# Patient Record
Sex: Male | Born: 2000 | Race: Black or African American | Hispanic: No | Marital: Single | Smoking: Never smoker
Health system: Southern US, Community
[De-identification: ages and names within clinical notes are randomized; demographics above are authoritative.]

## PROBLEM LIST (undated history)

## (undated) DIAGNOSIS — R040 Epistaxis: Secondary | ICD-10-CM

## (undated) DIAGNOSIS — J45909 Unspecified asthma, uncomplicated: Secondary | ICD-10-CM

## (undated) HISTORY — PX: HERNIA REPAIR: SHX51

---
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
## 2020-01-28 ENCOUNTER — Ambulatory Visit: Payer: Self-pay

## 2021-02-16 ENCOUNTER — Other Ambulatory Visit: Payer: Self-pay

## 2021-02-16 ENCOUNTER — Ambulatory Visit: Payer: Self-pay | Admitting: Physician Assistant

## 2021-02-16 DIAGNOSIS — Z202 Contact with and (suspected) exposure to infections with a predominantly sexual mode of transmission: Secondary | ICD-10-CM

## 2021-02-16 DIAGNOSIS — Z113 Encounter for screening for infections with a predominantly sexual mode of transmission: Secondary | ICD-10-CM

## 2021-02-16 MED ORDER — DOXYCYCLINE HYCLATE 100 MG PO TABS: 100.0000 mg | ORAL_TABLET | Freq: Two times a day (BID) | ORAL | 0 refills | Status: AC

## 2021-02-17 ENCOUNTER — Encounter: Payer: Self-pay | Admitting: Physician Assistant

## 2021-02-17 NOTE — Progress Notes (Signed)
   [facility] Department STI clinic/screening visit  Subjective:  Jesse Palmer is a 20 y.o. male being seen today for an STI screening visit. The patient reports they do have symptoms.    Patient has the following medical conditions:  There are no problems to display for this patient.    Chief Complaint  Patient presents with  . SEXUALLY TRANSMITTED DISEASE    screening    HPI  Patient reports that he is a contact to Chlamydia and that he has been having dysuria off and on for 1 week.  Denies other symptoms, chronic conditions and regular medicines.  Reports last HIV test was about 1 year ago and last void prior to visit was about 15 min ago.     See flowsheet for further details and programmatic requirements.    The following portions of the patient's history were reviewed and updated as appropriate: allergies, current medications, past medical history, past social history, past surgical history and problem list.  Objective:  There were no vitals filed for this visit.  Physical Exam Constitutional:      General: He is not in acute distress.    Appearance: Normal appearance.  HENT:     Head: Normocephalic and atraumatic.  Eyes:     Conjunctiva/sclera: Conjunctivae normal.  Pulmonary:     Effort: Pulmonary effort is normal.  Skin:    General: Skin is warm and dry.  Neurological:     Mental Status: He is alert and oriented to person, place, and time.  Psychiatric:        Mood and Affect: Mood normal.        Behavior: Behavior normal.        Thought Content: Thought content normal.        Judgment: Judgment normal.       Assessment and Plan:  Eben Choinski is a 20 y.o. male presenting to the [facility] Department for STI screening  1. Screening for STD (sexually transmitted disease) Patient into clinic without symptoms. Patient declines screening exam/blood work today and requests treatment only.  Rec condoms with all sex.   2.  Chlamydia contact Will treat as a contact to Chlamydia with Doxycycline 100 mg #141 po BID for 7 days. No sex for 14 days and until after partner completes treatment. Call with questions or concerns. - doxycycline (VIBRA-TABS) 100 MG tablet; Take 1 tablet (100 mg total) by mouth 2 (two) times daily for 7 days.  Dispense: 14 tablet; Refill: 0     No follow-ups on file.  No future appointments.  Matt Holmes, PA

## 2021-02-18 NOTE — Progress Notes (Signed)
Chart reviewed by Pharmacist  Suzanne Walker PharmD, Contract Pharmacist at [facility]  

## 2021-06-09 ENCOUNTER — Emergency Department
Admission: EM | Admit: 2021-06-09 | Discharge: 2021-06-09 | Disposition: A | Discharge status: Home / Self Care | Payer: BC Managed Care – PPO | Attending: Emergency Medicine | Admitting: Emergency Medicine

## 2021-06-09 ENCOUNTER — Other Ambulatory Visit: Payer: Self-pay

## 2021-06-09 DIAGNOSIS — J45909 Unspecified asthma, uncomplicated: Secondary | ICD-10-CM | POA: Diagnosis not present

## 2021-06-09 DIAGNOSIS — K0889 Other specified disorders of teeth and supporting structures: Secondary | ICD-10-CM | POA: Diagnosis present

## 2021-06-09 DIAGNOSIS — K047 Periapical abscess without sinus: Secondary | ICD-10-CM | POA: Diagnosis not present

## 2021-06-09 MED ORDER — DOXYCYCLINE HYCLATE 100 MG PO TABS: 100.0000 mg | ORAL_TABLET | Freq: Two times a day (BID) | ORAL | 0 refills | Status: AC

## 2021-06-09 MED ORDER — DOXYCYCLINE HYCLATE 100 MG PO TABS
100.0000 mg | ORAL_TABLET | Freq: Once | ORAL | Status: AC
  Administered 2021-06-09: 100 mg via ORAL
  Filled 2021-06-09: qty 1

## 2021-06-09 MED ORDER — IBUPROFEN 600 MG PO TABS: 600.0000 mg | ORAL_TABLET | Freq: Three times a day (TID) | ORAL | 0 refills | Status: AC | PRN

## 2021-06-09 MED ORDER — IBUPROFEN 800 MG PO TABS
800.0000 mg | ORAL_TABLET | Freq: Once | ORAL | Status: AC
  Administered 2021-06-09: 800 mg via ORAL
  Filled 2021-06-09: qty 1

## 2021-06-09 NOTE — Discharge Instructions (Addendum)
You were seen today for dental pain.  You likely have an abscessed wisdom tooth.  I put you on oral antibiotics twice daily for the next 10 days.  I put you on anti-inflammatories 3 times daily to take as needed for pain and inflammation.  Please take this with food.  You may do salt water gargles for comfort.  Please follow-up with your dentist for further evaluation

## 2021-06-09 NOTE — ED Provider Notes (Signed)
[facility] Emergency Department Provider Note ____________________________________________  Time seen: 2245  I have reviewed the triage vital signs and the nursing notes.  HISTORY  Chief Complaint  Dental Pain   HPI Jesse Palmer is a 20 y.o. male patient presents the clinic today with right lower tooth pain.  He reports this started 2 days ago.  He describes the pain as achy and throbbing.  The pain radiates into the right side of his jaw.  He reports he has a wisdom tooth coming in in this area and can taste and smell pus.  He denies fever, chills or body aches but has had some difficulty chewing.  He has a Education officer, community but has not set up an appointment with them yet.  Past Medical History:  Diagnosis Date   Asthma    Epistaxis     There are no problems to display for this patient.   Past Surgical History:  Procedure Laterality Date   HERNIA REPAIR      Prior to Admission medications   Medication Sig Start Date End Date Taking? Authorizing Provider  doxycycline (VIBRA-TABS) 100 MG tablet Take 1 tablet (100 mg total) by mouth 2 (two) times daily. 06/09/21  Yes Lorre Munroe, NP  ibuprofen (ADVIL) 600 MG tablet Take 1 tablet (600 mg total) by mouth every 8 (eight) hours as needed. 06/09/21  Yes Lorre Munroe, NP    Allergies Amoxicillin  No family history on file.  Social History Social History   Tobacco Use   Smoking status: Never   Smokeless tobacco: Never  Substance Use Topics   Alcohol use: No   Drug use: Not Currently    Types: Marijuana    Review of Systems  Constitutional: Negative for fever, chills or body. ENT: Positive for dental pain. Cardiovascular: Negative for chest pain or chest tightness. Respiratory: Negative for shortness of breath. Neurological: Negative for headaches or dizziness. ____________________________________________  PHYSICAL EXAM:  VITAL SIGNS: ED Triage Vitals  Enc Vitals Group     BP 06/09/21 1944  122/89     Pulse Rate 06/09/21 1944 68     Resp 06/09/21 1944 16     Temp 06/09/21 1944 98.7 F (37.1 C)     Temp Source 06/09/21 1944 Oral     SpO2 06/09/21 1944 100 %     Weight 06/09/21 1940 135 lb (61.2 kg)     Height 06/09/21 1940 5\' 10"  (1.778 m)     Head Circumference --      Peak Flow --      Pain Score 06/09/21 1940 7     Pain Loc --      Pain Edu? --      Excl. in GC? --     Constitutional: Alert and oriented. Well appearing and in no distress. Head: Normocephalic.  No facial swelling noted. Mouth/Throat: Mucous membranes are moist.  Pustule noted overlying tooth #32. Hematological/Lymphatic/Immunological: No cervical lymphadenopathy. Cardiovascular: Normal rate, regular rhythm.  Respiratory: Normal respiratory effort. No wheezes/rales/rhonchi. Musculoskeletal: Pain with palpation along the right mandible. Neurologic:  Normal speech and language. No gross focal neurologic deficits are appreciated. Skin:  Skin is warm, dry and intact. No rash noted. ____________________________________________  INITIAL IMPRESSION / ASSESSMENT AND PLAN / ED COURSE  Dental Pain:  DDx include wisdom tooth impaction, dental abscess Doxycycline 100 mg p.o. x1 in ER Ibuprofen 800 mg p.o. x1 in ER Rx for Doxycycline 100 mg p.o. twice daily x10 days Rx for Ibuprofen 600  mg every 8 hours as needed, consume with food Encouraged him to do salt water gargles for comfort He will follow-up with a dentist as an outpatient ____________________________________________  FINAL CLINICAL IMPRESSION(S) / ED DIAGNOSES  Final diagnoses:  Dental abscess      Lorre Munroe, NP 06/09/21 2243    Phineas Semen, MD 06/09/21 2258

## 2021-06-09 NOTE — ED Triage Notes (Signed)
Patient report right lower back dental pain and concerned it might be an abscess.

## 2021-08-19 ENCOUNTER — Other Ambulatory Visit: Payer: Self-pay

## 2021-08-19 ENCOUNTER — Emergency Department: Payer: BC Managed Care – PPO

## 2021-08-19 DIAGNOSIS — S0081XA Abrasion of other part of head, initial encounter: Secondary | ICD-10-CM | POA: Diagnosis not present

## 2021-08-19 DIAGNOSIS — S80812A Abrasion, left lower leg, initial encounter: Secondary | ICD-10-CM | POA: Insufficient documentation

## 2021-08-19 DIAGNOSIS — S0993XA Unspecified injury of face, initial encounter: Secondary | ICD-10-CM | POA: Diagnosis present

## 2021-08-19 DIAGNOSIS — J45909 Unspecified asthma, uncomplicated: Secondary | ICD-10-CM | POA: Diagnosis not present

## 2021-08-19 DIAGNOSIS — Y92410 Unspecified street and highway as the place of occurrence of the external cause: Secondary | ICD-10-CM | POA: Diagnosis not present
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
## 2021-08-20 ENCOUNTER — Emergency Department
Admission: EM | Admit: 2021-08-20 | Discharge: 2021-08-20 | Disposition: A | Discharge status: Home / Self Care | Payer: BC Managed Care – PPO | Attending: Emergency Medicine | Admitting: Emergency Medicine

## 2021-08-20 DIAGNOSIS — S0081XA Abrasion of other part of head, initial encounter: Secondary | ICD-10-CM

## 2021-08-20 DIAGNOSIS — R52 Pain, unspecified: Secondary | ICD-10-CM

## 2021-08-20 DIAGNOSIS — S80812A Abrasion, left lower leg, initial encounter: Secondary | ICD-10-CM

## 2021-08-20 MED ORDER — BACITRACIN ZINC 500 UNIT/GM EX OINT
TOPICAL_OINTMENT | Freq: Two times a day (BID) | CUTANEOUS | Status: DC
  Filled 2021-08-20: qty 0.9
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
## 2022-09-28 ENCOUNTER — Other Ambulatory Visit: Payer: Self-pay

## 2022-09-28 ENCOUNTER — Emergency Department: Payer: BC Managed Care – PPO

## 2022-09-28 ENCOUNTER — Emergency Department
Admission: EM | Admit: 2022-09-28 | Discharge: 2022-09-28 | Disposition: A | Discharge status: Home / Self Care | Payer: BC Managed Care – PPO | Attending: Emergency Medicine | Admitting: Emergency Medicine

## 2022-09-28 DIAGNOSIS — N503 Cyst of epididymis: Secondary | ICD-10-CM | POA: Insufficient documentation

## 2022-09-28 DIAGNOSIS — N433 Hydrocele, unspecified: Secondary | ICD-10-CM | POA: Insufficient documentation

## 2022-09-28 DIAGNOSIS — J45909 Unspecified asthma, uncomplicated: Secondary | ICD-10-CM | POA: Diagnosis not present

## 2022-09-28 DIAGNOSIS — N50811 Right testicular pain: Secondary | ICD-10-CM | POA: Diagnosis present

## 2022-09-28 NOTE — ED Notes (Signed)
Pt given cup for urine sample 

## 2022-09-28 NOTE — ED Provider Notes (Signed)
[facility] Emergency Department Provider Note   ____________________________________________   Event Date/Time   First MD Initiated Contact with Patient 09/28/22 1727     (approximate)  I have reviewed the triage vital signs and the nursing notes.   HISTORY  Chief Complaint Testicle Pain    HPI Jesse Palmer is a 21 y.o. male presents to the emergency room for complaint of right testicular pain and swelling. Patient reports that 3 days ago he started with testicular pain.  Then 2 days ago he noted that his right testicle was harder than the left. Patient denies any penile discharge.  Patient denies any urinary symptoms.  He denies any STD exposure.  He denies any injury to the groin.  He denies any change in pain with standing or sitting. Patient reports the pain as a 5-7 out of 10.  Patient has not taken anything to relieve the pain.   Past Medical History:  Diagnosis Date   Asthma    Epistaxis     There are no problems to display for this patient.   Past Surgical History:  Procedure Laterality Date   HERNIA REPAIR      Prior to Admission medications   Medication Sig Start Date End Date Taking? Authorizing Provider  doxycycline (VIBRA-TABS) 100 MG tablet Take 1 tablet (100 mg total) by mouth 2 (two) times daily. 06/09/21   Lorre Munroe, NP  ibuprofen (ADVIL) 600 MG tablet Take 1 tablet (600 mg total) by mouth every 8 (eight) hours as needed. 06/09/21   Lorre Munroe, NP    Allergies Amoxicillin  History reviewed. No pertinent family history.  Social History Social History   Tobacco Use   Smoking status: Never   Smokeless tobacco: Never  Substance Use Topics   Alcohol use: No   Drug use: Not Currently    Types: Marijuana    Review of Systems  Constitutional: No fever/chills Eyes: No visual changes. ENT: No sore throat. Cardiovascular: Denies chest pain. Respiratory: Denies shortness of breath. Gastrointestinal: No  abdominal pain.  No nausea, no vomiting.  No diarrhea.  No constipation. Genitourinary: Right testicular pain/swelling. Musculoskeletal: Negative for back pain. Skin: Negative for rash. Neurological: Negative for headaches, focal weakness or numbness.   ____________________________________________   PHYSICAL EXAM:  VITAL SIGNS: ED Triage Vitals  Enc Vitals Group     BP 09/28/22 1657 135/85     Pulse Rate 09/28/22 1657 88     Resp 09/28/22 1657 16     Temp 09/28/22 1657 98 F (36.7 C)     Temp Source 09/28/22 1657 Oral     SpO2 09/28/22 1657 100 %     Weight 09/28/22 1658 125 lb (56.7 kg)     Height 09/28/22 1658 5\' 10"  (1.778 m)     Head Circumference --      Peak Flow --      Pain Score 09/28/22 1658 8     Pain Loc --      Pain Edu? --      Excl. in GC? --     Constitutional: Alert and oriented. Well appearing and in no acute distress. Eyes: Conjunctivae are normal. PERRL. EOMI. Head: Atraumatic. Nose: No congestion/rhinnorhea. Mouth/Throat: Mucous membranes are moist.  Oropharynx non-erythematous. Neck: No stridor.   Cardiovascular: Normal rate, regular rhythm. Grossly normal heart sounds.  Good peripheral circulation. Respiratory: Normal respiratory effort.  No retractions. Lungs CTAB. Gastrointestinal: Soft and nontender. No distention. No abdominal bruits. No CVA  tenderness. GU: Patient's right testicle is noted to be twice the size of left.  Patient is tender to palpation.  Right testicle is firm to palpation. Musculoskeletal: No lower extremity tenderness nor edema.  No joint effusions. Neurologic:  Normal speech and language. No gross focal neurologic deficits are appreciated. No gait instability. Skin:  Skin is warm, dry and intact. No rash noted. Psychiatric: Mood and affect are normal. Speech and behavior are normal.  ____________________________________________   LABS (all labs ordered are listed, but only abnormal results are displayed)  Labs Reviewed  - No data to display ____________________________________________  EKG   ____________________________________________  RADIOLOGY  ED MD interpretation: Ultrasound results were reviewed by me and read by radiologist.  Official radiology report(s): US SCROTUM W/DOPPLER  Result Date: 09/28/2022 CLINICAL DATA:  Right scrotal pain and swelling over the last 4 days. EXAM: SCROTAL ULTRASOUND DOPPLER ULTRASOUND OF THE TESTICLES TECHNIQUE: Complete ultrasound examination of the testicles, epididymis, and other scrotal structures was performed. Color and spectral Doppler ultrasound were also utilized to evaluate blood flow to the testicles. COMPARISON:  None Available. FINDINGS: Right testicle Measurements: 3.8 x 1.9 x 3.3 cm. Normal morphology. No hyperemia. Normal color flow pattern. Left testicle Measurements: 4.5 x 2.1 x 2.8 cm. Normal morphology. No hyperemia. Normal color flow pattern. Right epididymis: 2 epididymal cysts are noted, each measuring 5 mm. No hyperemia. Left epididymis:  Normal in size and appearance. Hydrocele:  Small right hydrocele. Varicocele:  Small left varicocele. Pulsed Doppler interrogation of both testes demonstrates normal low resistance arterial and venous waveforms bilaterally. IMPRESSION: Normal appearance of both testicles. Small hydrocele on the right. Two small right-sided epididymal cysts, each measuring 5 mm. Both of these findings could possibly explain the sensation of fullness on the right. No evidence of epididymo-orchitis. Symmetric and normal doppler and color flow patterns. Electronically Signed   By: Paulina Fusi M.D.   On: 09/28/2022 19:02    ____________________________________________   PROCEDURES  Procedure(s) performed: None  Procedures  Critical Care performed: No  ____________________________________________   INITIAL IMPRESSION / ASSESSMENT AND PLAN / ED COURSE     Jesse Palmer is a 21 y.o. male presents to the emergency room for  complaint of right testicular pain and swelling. Patient reports that 3 days ago he started with testicular pain.  Then 2 days ago he noted that his right testicle was harder than the left. Patient denies any penile discharge.  Patient denies any urinary symptoms.  He denies any STD exposure.  He denies any injury to the groin.  He denies any change in pain with standing or sitting. Patient reports the pain as a 5-7 out of 10.  Patient has not taken anything to relieve the pain.  Will order for scrotal ultrasound - results pending. Ultrasound shows patient has small hydrocele with epididymal cyst. I discussed these findings with the patient and normal course of action.  I also discussed with him that if symptoms persist or worsen he should follow-up with primary care provider for possible referral to urology Patient should take Tylenol or ibuprofen for pain/discomfort.  Patient will be discharged home in stable condition at this time.      ____________________________________________   FINAL CLINICAL IMPRESSION(S) / ED DIAGNOSES  Final diagnoses:  Epididymal cyst  Hydrocele, unspecified hydrocele type     ED Discharge Orders     None        Note:  This document was prepared using Dragon voice recognition software and may include  unintentional dictation errors.     Herschell Dimes, NP 09/28/22 2010    Merwyn Katos, MD 09/28/22 [phone]

## 2022-09-28 NOTE — Discharge Instructions (Signed)
You have been seen today in the emergency room for testicular pain.  You have been diagnosed with a hydrocele.  Usually hydroceles require no intervention.  However if your pain persist or worsens please follow-up with your primary care provider.

## 2022-09-28 NOTE — ED Triage Notes (Signed)
Pt states that his R testicle is swollen- pt states it started 3-4 days ago- pt states that it is painful- pt denies any urinary symptoms- pt denies penile discharge
